# Patient Record
Sex: Female | Born: 1975 | Race: White | Hispanic: No | Marital: Married | Smoking: Never smoker
Health system: Southern US, Community
[De-identification: ages and names within clinical notes are randomized; demographics above are authoritative.]

## PROBLEM LIST (undated history)

## (undated) DIAGNOSIS — F329 Major depressive disorder, single episode, unspecified: Secondary | ICD-10-CM

## (undated) DIAGNOSIS — I10 Essential (primary) hypertension: Secondary | ICD-10-CM

## (undated) DIAGNOSIS — F32A Depression, unspecified: Secondary | ICD-10-CM

## (undated) DIAGNOSIS — M797 Fibromyalgia: Secondary | ICD-10-CM

## (undated) HISTORY — DX: Depression, unspecified: F32.A

## (undated) HISTORY — DX: Fibromyalgia: M79.7

## (undated) HISTORY — DX: Major depressive disorder, single episode, unspecified: F32.9

## (undated) HISTORY — PX: TONSILLECTOMY AND ADENOIDECTOMY: SUR1326

## (undated) HISTORY — DX: Essential (primary) hypertension: I10

## (undated) HISTORY — PX: CHOLECYSTECTOMY: SHX55

---

## 2007-01-01 ENCOUNTER — Encounter: Admission: RE | Admit: 2007-01-01 | Discharge: 2007-01-01 | Payer: Self-pay | Admitting: Orthopaedic Surgery

## 2008-09-09 ENCOUNTER — Ambulatory Visit: Payer: Self-pay | Admitting: Psychiatry

## 2008-09-29 ENCOUNTER — Ambulatory Visit: Payer: Self-pay | Admitting: Psychiatry

## 2009-04-08 ENCOUNTER — Encounter: Admission: RE | Admit: 2009-04-08 | Discharge: 2009-04-08 | Payer: Self-pay | Admitting: Family Medicine

## 2009-12-14 ENCOUNTER — Ambulatory Visit: Payer: Self-pay | Admitting: Psychiatry

## 2009-12-14 ENCOUNTER — Inpatient Hospital Stay (HOSPITAL_COMMUNITY): Admission: RE | Admit: 2009-12-14 | Discharge: 2009-12-17 | Payer: Self-pay | Admitting: Psychiatry

## 2010-04-11 LAB — COMPREHENSIVE METABOLIC PANEL
ALT: 45 U/L — ABNORMAL HIGH (ref 0–35)
Calcium: 9.3 mg/dL (ref 8.4–10.5)
Chloride: 104 mEq/L (ref 96–112)
GFR calc Af Amer: >60 mL/min (ref >60)
Glucose, Bld: 90 mg/dL (ref 70–99)
Sodium: 140 mEq/L (ref 135–145)
Total Bilirubin: 0.5 mg/dL (ref 0.3–1.2)
Total Protein: 7.1 g/dL (ref 6.0–8.3)

## 2010-04-11 LAB — URINE DRUGS OF ABUSE SCREEN W ALC, ROUTINE (REF LAB)
Barbiturate Quant, Ur: NEGATIVE
Benzodiazepines.: NEGATIVE
Creatinine,U: 60.3 mg/dL
Ethyl Alcohol: <10 mg/dL (ref <10)
Marijuana Metabolite: NEGATIVE
Methadone: NEGATIVE
Opiate Screen, Urine: NEGATIVE
Propoxyphene: NEGATIVE

## 2010-04-11 LAB — CBC
Hemoglobin: 13.2 g/dL (ref 12.0–15.0)
MCH: 32.4 pg (ref 26.0–34.0)
MCV: 93.8 fL (ref 78.0–100.0)
WBC: 11.8 10*3/uL — ABNORMAL HIGH (ref 4.0–10.5)

## 2010-04-11 LAB — URINALYSIS, ROUTINE W REFLEX MICROSCOPIC
Bilirubin Urine: NEGATIVE
Glucose, UA: NEGATIVE mg/dL
Specific Gravity, Urine: 1.012 (ref 1.005–1.030)
Urobilinogen, UA: 0.2 mg/dL (ref 0.0–1.0)

## 2010-04-11 LAB — PREGNANCY, URINE: Preg Test, Ur: NEGATIVE

## 2013-01-06 ENCOUNTER — Encounter (INDEPENDENT_AMBULATORY_CARE_PROVIDER_SITE_OTHER): Payer: Self-pay | Admitting: Surgery

## 2013-01-14 ENCOUNTER — Telehealth (INDEPENDENT_AMBULATORY_CARE_PROVIDER_SITE_OTHER): Payer: Self-pay | Admitting: Surgery

## 2013-01-14 ENCOUNTER — Ambulatory Visit (INDEPENDENT_AMBULATORY_CARE_PROVIDER_SITE_OTHER): Payer: BC Managed Care – PPO | Admitting: Surgery

## 2013-01-14 ENCOUNTER — Encounter (INDEPENDENT_AMBULATORY_CARE_PROVIDER_SITE_OTHER): Payer: Self-pay

## 2013-01-14 ENCOUNTER — Encounter (INDEPENDENT_AMBULATORY_CARE_PROVIDER_SITE_OTHER): Payer: Self-pay | Admitting: Surgery

## 2013-01-14 VITALS — BP 118/80 | HR 80 | Resp 16 | Ht 66.0 in | Wt 213.4 lb

## 2013-01-14 DIAGNOSIS — K8010 Calculus of gallbladder with chronic cholecystitis without obstruction: Secondary | ICD-10-CM

## 2013-01-14 NOTE — Progress Notes (Signed)
Patient ID: Catherine Watts, female   DOB: 1975-06-04, 37 y.o.   MRN: [id number]  Chief Complaint  Patient presents with  . Cholelithiasis    HPI Catherine Watts is a 37 y.o. female.  Referred by Dr. Maurice Small for evaluation of gallstones  HPI This is a Healthy 37 year old female who presents with a history of a severe episode of right upper quadrant abdominal pain that radiated around her side to her back associated with shortness of breath, nausea, vomiting. She called 911 and was taken to the emergency department. She was ruled out for cardiac disease and had an ultrasound showing gallstones. Her symptoms improved. She has had one mild attack in the last week. She has been trying to stick with a low fat diet. She reports occasional diarrhea. She does report some abdominal bloating.   Past Medical History  Diagnosis Date  . Depression   . Hypertension   . Fibromyalgia     Past Surgical History  Procedure Laterality Date  . Tonsillectomy and adenoidectomy      History reviewed. No pertinent family history.  Social History History  Substance Use Topics  . Smoking status: Never Smoker   . Smokeless tobacco: Not on file  . Alcohol Use: Yes     Comment: Occasionally    Allergies  Allergen Reactions  . Cephalexin Hives  . Penicillins Nausea Only    Current Outpatient Prescriptions  Medication Sig Dispense Refill  . DULoxetine (CYMBALTA) 60 MG capsule Take 60 mg by mouth daily.      Marland Kitchen HYDROcodone-homatropine (HYCODAN) 5-1.5 MG/5ML syrup Take 5 mLs by mouth every 6 (six) hours as needed for cough.      . topiramate (TOPAMAX) 25 MG capsule Take 25 mg by mouth 2 (two) times daily.       No current facility-administered medications for this visit.    Review of Systems Review of Systems  Constitutional: Negative for fever, chills and unexpected weight change.  HENT: Negative for congestion, hearing loss, sore throat, trouble swallowing and voice change.   Eyes:  Negative for visual disturbance.  Respiratory: Negative for cough and wheezing.   Cardiovascular: Negative for chest pain, palpitations and leg swelling.  Gastrointestinal: Positive for nausea, vomiting, abdominal pain, diarrhea and abdominal distention. Negative for constipation, blood in stool and anal bleeding.  Genitourinary: Negative for hematuria, vaginal bleeding and difficulty urinating.  Musculoskeletal: Negative for arthralgias.  Skin: Negative for rash and wound.  Neurological: Negative for seizures, syncope and headaches.  Hematological: Negative for adenopathy. Does not bruise/bleed easily.  Psychiatric/Behavioral: Negative for confusion.    Blood pressure 118/80, pulse 80, resp. rate 16, height 5\' 6"  (1.676 m), weight 213 lb 6.4 oz (96.798 kg).  Physical Exam Physical Exam WDWN in NAD HEENT:  EOMI, sclera anicteric Neck:  No masses, no thyromegaly Lungs:  CTA bilaterally; normal respiratory effort CV:  Regular rate and rhythm; no murmurs Abd:  +bowel sounds, soft, non-tender, no masses Ext:  Well-perfused; no edema Skin:  Warm, dry; no sign of jaundice  Data Reviewed  Labs from [employer] 11/18 WBC 10.3 Hgb 13.6 Plts 244  NA 135 K 3.5 Cl 102 CO2 22 BUN 13 Cr 0.9 Tbili 0.4 AST 24 ALT 24 Alk phos 70  Ultrasound - cholelithiasis without acute cholecystitis   Assessment    Chronic calculus cholecystitis     Plan    Laparoscopic cholecystectomy with intraoperative cholangiogram. The surgical procedure has been discussed with the patient.  Potential risks, benefits, alternative  treatments, and expected outcomes have been explained.  All of the patient's questions at this time have been answered.  The likelihood of reaching the patient's treatment goal is good.  The patient understand the proposed surgical procedure and wishes to proceed.         Shonta Phillis K. 01/14/2013, 4:42 PM

## 2013-01-14 NOTE — Telephone Encounter (Signed)
Pt made aware of [facility] financial obligation will call if and when to schedule surgery

## 2013-02-05 ENCOUNTER — Encounter (INDEPENDENT_AMBULATORY_CARE_PROVIDER_SITE_OTHER): Payer: Self-pay

## 2013-02-06 ENCOUNTER — Other Ambulatory Visit (HOSPITAL_COMMUNITY)
Admission: RE | Admit: 2013-02-06 | Discharge: 2013-02-06 | Disposition: A | Discharge status: Home / Self Care | Payer: BC Managed Care – PPO | Source: Ambulatory Visit | Attending: Obstetrics and Gynecology | Admitting: Obstetrics and Gynecology

## 2013-02-06 ENCOUNTER — Other Ambulatory Visit: Payer: Self-pay | Admitting: Obstetrics and Gynecology

## 2013-02-06 DIAGNOSIS — Z124 Encounter for screening for malignant neoplasm of cervix: Secondary | ICD-10-CM | POA: Insufficient documentation

## 2013-02-06 DIAGNOSIS — R87810 Cervical high risk human papillomavirus (HPV) DNA test positive: Secondary | ICD-10-CM | POA: Insufficient documentation

## 2013-02-06 DIAGNOSIS — Z1151 Encounter for screening for human papillomavirus (HPV): Secondary | ICD-10-CM | POA: Insufficient documentation

## 2013-02-26 ENCOUNTER — Other Ambulatory Visit (INDEPENDENT_AMBULATORY_CARE_PROVIDER_SITE_OTHER): Payer: Self-pay | Admitting: *Deleted

## 2013-02-26 ENCOUNTER — Other Ambulatory Visit (INDEPENDENT_AMBULATORY_CARE_PROVIDER_SITE_OTHER): Payer: Self-pay | Admitting: Surgery

## 2013-02-26 ENCOUNTER — Telehealth (INDEPENDENT_AMBULATORY_CARE_PROVIDER_SITE_OTHER): Payer: Self-pay | Admitting: General Surgery

## 2013-02-26 DIAGNOSIS — K824 Cholesterolosis of gallbladder: Secondary | ICD-10-CM

## 2013-02-26 MED ORDER — OXYCODONE-ACETAMINOPHEN 5-325 MG PO TABS: 1.0000 | ORAL_TABLET | ORAL | Status: AC | PRN

## 2013-02-26 NOTE — Telephone Encounter (Signed)
Her fiance, Ardis Hughsric Almond called and reported that she had some bloody drainage on the guaze covering her umbilical incision.  The was no blood leaking out from under the bandage.  I instructed him to reinforce the bandage with a piece of guaze them apply ice and direct pressure to the area for one hour.  If she started having active bleeding, I instructed him to take her to the ED.

## 2013-03-05 ENCOUNTER — Encounter (INDEPENDENT_AMBULATORY_CARE_PROVIDER_SITE_OTHER): Payer: Self-pay | Admitting: General Surgery

## 2013-03-10 ENCOUNTER — Encounter (INDEPENDENT_AMBULATORY_CARE_PROVIDER_SITE_OTHER): Payer: Self-pay | Admitting: Surgery

## 2013-03-10 ENCOUNTER — Ambulatory Visit (INDEPENDENT_AMBULATORY_CARE_PROVIDER_SITE_OTHER): Payer: BC Managed Care – PPO | Admitting: Surgery

## 2013-03-10 DIAGNOSIS — K8010 Calculus of gallbladder with chronic cholecystitis without obstruction: Secondary | ICD-10-CM

## 2013-03-10 NOTE — Progress Notes (Signed)
Status post laparoscopic cholecystectomy with cholangiogram on 02/26/13. Pathology confirmed some mild cholecystitis. Her symptoms are much improved. Appetite and bowel movements are normal. Her incisions are well-healed with no sign of infection. No abdominal tenderness.  She may resume full activity regular diet. Followup when necessary.  Wilmon ArmsMatthew K. Corliss Skainssuei, MD, [facility]  General/ Trauma Surgery  03/10/2013 4:23 PM

## 2013-03-30 ENCOUNTER — Other Ambulatory Visit: Payer: Self-pay | Admitting: Obstetrics and Gynecology

## 2014-04-07 ENCOUNTER — Other Ambulatory Visit (HOSPITAL_COMMUNITY)
Admission: RE | Admit: 2014-04-07 | Discharge: 2014-04-07 | Disposition: A | Discharge status: Home / Self Care | Payer: BC Managed Care – PPO | Source: Ambulatory Visit | Attending: Obstetrics and Gynecology | Admitting: Obstetrics and Gynecology

## 2014-04-07 ENCOUNTER — Other Ambulatory Visit: Payer: Self-pay | Admitting: Obstetrics and Gynecology

## 2014-04-07 DIAGNOSIS — Z01411 Encounter for gynecological examination (general) (routine) with abnormal findings: Secondary | ICD-10-CM | POA: Insufficient documentation

## 2014-04-07 DIAGNOSIS — Z1151 Encounter for screening for human papillomavirus (HPV): Secondary | ICD-10-CM | POA: Diagnosis present

## 2014-04-08 LAB — CYTOLOGY - PAP

## 2014-04-14 ENCOUNTER — Other Ambulatory Visit: Payer: Self-pay | Admitting: Obstetrics and Gynecology

## 2015-04-18 ENCOUNTER — Other Ambulatory Visit: Payer: Self-pay | Admitting: Obstetrics and Gynecology

## 2015-04-18 ENCOUNTER — Other Ambulatory Visit (HOSPITAL_COMMUNITY)
Admission: RE | Admit: 2015-04-18 | Discharge: 2015-04-18 | Disposition: A | Discharge status: Home / Self Care | Payer: BC Managed Care – PPO | Source: Ambulatory Visit | Attending: Obstetrics and Gynecology | Admitting: Obstetrics and Gynecology

## 2015-04-18 DIAGNOSIS — Z01411 Encounter for gynecological examination (general) (routine) with abnormal findings: Secondary | ICD-10-CM | POA: Insufficient documentation

## 2015-04-18 DIAGNOSIS — Z1151 Encounter for screening for human papillomavirus (HPV): Secondary | ICD-10-CM | POA: Insufficient documentation

## 2015-04-19 LAB — CYTOLOGY - PAP

## 2015-05-03 ENCOUNTER — Other Ambulatory Visit: Payer: Self-pay

## 2015-05-03 DIAGNOSIS — Z1231 Encounter for screening mammogram for malignant neoplasm of breast: Secondary | ICD-10-CM

## 2015-05-04 ENCOUNTER — Other Ambulatory Visit: Payer: Self-pay | Admitting: Obstetrics and Gynecology

## 2015-10-13 ENCOUNTER — Ambulatory Visit
Admission: RE | Admit: 2015-10-13 | Discharge: 2015-10-13 | Disposition: A | Discharge status: Home / Self Care | Payer: BC Managed Care – PPO | Source: Ambulatory Visit

## 2015-10-13 DIAGNOSIS — Z1231 Encounter for screening mammogram for malignant neoplasm of breast: Secondary | ICD-10-CM

## 2016-04-17 ENCOUNTER — Other Ambulatory Visit: Payer: Self-pay | Admitting: Obstetrics and Gynecology

## 2016-04-17 ENCOUNTER — Other Ambulatory Visit (HOSPITAL_COMMUNITY)
Admission: RE | Admit: 2016-04-17 | Discharge: 2016-04-17 | Disposition: A | Discharge status: Home / Self Care | Payer: BC Managed Care – PPO | Source: Ambulatory Visit | Attending: Obstetrics and Gynecology | Admitting: Obstetrics and Gynecology

## 2016-04-17 DIAGNOSIS — Z01411 Encounter for gynecological examination (general) (routine) with abnormal findings: Secondary | ICD-10-CM | POA: Diagnosis present

## 2016-04-17 DIAGNOSIS — Z1151 Encounter for screening for human papillomavirus (HPV): Secondary | ICD-10-CM | POA: Diagnosis not present

## 2016-04-22 LAB — CYTOLOGY - PAP
Adequacy: ABSENT
Diagnosis: NEGATIVE
HPV: NOT DETECTED

## 2016-09-03 ENCOUNTER — Other Ambulatory Visit: Payer: Self-pay | Admitting: Obstetrics and Gynecology

## 2016-09-03 DIAGNOSIS — Z1231 Encounter for screening mammogram for malignant neoplasm of breast: Secondary | ICD-10-CM

## 2016-09-24 ENCOUNTER — Ambulatory Visit (INDEPENDENT_AMBULATORY_CARE_PROVIDER_SITE_OTHER): Payer: BC Managed Care – PPO | Admitting: Podiatry

## 2016-09-24 ENCOUNTER — Ambulatory Visit (INDEPENDENT_AMBULATORY_CARE_PROVIDER_SITE_OTHER): Payer: BC Managed Care – PPO

## 2016-09-24 ENCOUNTER — Encounter: Payer: Self-pay | Admitting: Podiatry

## 2016-09-24 VITALS — BP 112/71 | HR 81 | Ht 66.0 in | Wt 220.0 lb

## 2016-09-24 DIAGNOSIS — M722 Plantar fascial fibromatosis: Secondary | ICD-10-CM | POA: Diagnosis not present

## 2016-09-24 DIAGNOSIS — M205X2 Other deformities of toe(s) (acquired), left foot: Secondary | ICD-10-CM

## 2016-09-24 MED ORDER — MELOXICAM 15 MG PO TABS: 15.0000 mg | ORAL_TABLET | Freq: Every day | ORAL | 0 refills | Status: DC

## 2016-09-24 NOTE — Progress Notes (Signed)
Subjective:  Patient ID: Catherine Watts, female    DOB: July 11, 1975,  MRN: [id number] HPI Chief Complaint  Patient presents with  . Foot Pain    Left heel pain hx of 3 mo   now pain in the great to joint hx of 1 mo     41 y.o. female presents with the above complaints. Reports pain in the L heel for 3 months. Endorses a hx of plantar fasciitis; has had this issue before, previously saw Dr. Elijah Birk who gave her a night splint and injections which alleviated the condition. Describes pain in her heel that is worst with periods of inactivity.  Also reports 1 month hx of L great toe joint pain. Pain described as stiffness. Hurts most at the end of the day. Denies prior treatment.  Past Medical History:  Diagnosis Date  . Depression   . Fibromyalgia   . Hypertension    Past Surgical History:  Procedure Laterality Date  . CHOLECYSTECTOMY    . TONSILLECTOMY AND ADENOIDECTOMY      Current Outpatient Prescriptions:  .  FLUoxetine (PROZAC) 10 MG capsule, , Disp: , Rfl:  .  JUNEL FE 24 1-20 MG-MCG(24) tablet, , Disp: , Rfl:  .  losartan (COZAAR) 50 MG tablet, , Disp: , Rfl:  .  DULoxetine (CYMBALTA) 60 MG capsule, Take 60 mg by mouth daily., Disp: , Rfl:  .  HYDROcodone-homatropine (HYCODAN) 5-1.5 MG/5ML syrup, Take 5 mLs by mouth every 6 (six) hours as needed for cough., Disp: , Rfl:  .  meloxicam (MOBIC) 15 MG tablet, Take 1 tablet (15 mg total) by mouth daily., Disp: 30 tablet, Rfl: 0 .  topiramate (TOPAMAX) 25 MG capsule, Take 25 mg by mouth 2 (two) times daily., Disp: , Rfl:   Allergies  Allergen Reactions  . Cephalexin Hives  . Penicillins Nausea Only   Review of Systems  All other systems reviewed and are negative.  Objective:   Vitals:   09/24/16 1624  BP: 112/71  Pulse: 81   General AA&O x3. Normal mood and affect.  Vascular Dorsalis pedis and posterior tibial pulses  present 2+ bilaterally  Capillary refill immediate to all digits. Pedal hair growth normal.    Neurologic Epicritic sensation grossly present bilaterally.  Dermatologic No open lesions. Interspaces clear of maceration. Nails well groomed and normal in appearance.  Orthopedic: MMT 5/5 in dorsiflexion, plantarflexion, inversion, and eversion bilaterally. Tender to palpation at the calcaneal tuber left. No pain with calcaneal squeeze left. Ankle ROM diminished range of motion bilat. Silfverskiold Test: positive left 1st MPJ ROM limited with bony end feel bilat with loading of the forefoot.   Radiographs: Taken and reviewed. No acute fractures. No evidence of calcaneal stress fracture. Small plantar calcaneal spurring noted. 1st Ray elevatus noted with dorsal spurring.  Assessment & Plan:  Patient was evaluated and treated and all questions answered.  Plantar Fasciitis, left - XR reviewed as above.  - Educated on icing and stretching. Instructions given. Ice pack dispensed. - Injection consisting of 1cc 0.5 % Marcaine plain, 1cc dexamethasone phospage, 0.5 cc Kenalog delivered to the plantar fascia. - Patient declined night splint as she states she has one.  Hallux Limitus, left - XR reviewed as above. - Discussed conservative vs surgical treatment of hallux limitus.  - Specifically discussed custom molded orthoses with the patient. Will make appointment with our orthotist. Plan for 1st ray cutout for hallux limitus, soft heel cup for plantar fasciitis.  Return in about 4 weeks (around  10/22/2016). 

## 2016-09-24 NOTE — Patient Instructions (Signed)

## 2016-09-27 ENCOUNTER — Ambulatory Visit (INDEPENDENT_AMBULATORY_CARE_PROVIDER_SITE_OTHER): Payer: BC Managed Care – PPO | Admitting: Orthotics

## 2016-09-27 DIAGNOSIS — M722 Plantar fascial fibromatosis: Secondary | ICD-10-CM

## 2016-09-27 DIAGNOSIS — M205X2 Other deformities of toe(s) (acquired), left foot: Secondary | ICD-10-CM

## 2016-09-27 NOTE — Progress Notes (Signed)
Patient presents today for CMFO to address FHL and PF (Left); Patient was scanned with first ray plantar flexed.  Goal is arch support, rear foot stability, reverse Mortons.  All to take pressure off PF at calcaneal insertion, and to drop hinge pin of 1 MPJ to enhance windlass.   Richy to fab.

## 2016-10-15 ENCOUNTER — Ambulatory Visit: Payer: BC Managed Care – PPO

## 2016-10-22 ENCOUNTER — Ambulatory Visit (INDEPENDENT_AMBULATORY_CARE_PROVIDER_SITE_OTHER): Payer: BC Managed Care – PPO | Admitting: Podiatry

## 2016-10-22 DIAGNOSIS — M722 Plantar fascial fibromatosis: Secondary | ICD-10-CM

## 2016-10-22 DIAGNOSIS — M205X2 Other deformities of toe(s) (acquired), left foot: Secondary | ICD-10-CM

## 2016-10-22 NOTE — Progress Notes (Signed)
Patient ID: Catherine Watts, female   DOB: Jun 09, 1975, 41 y.o.   MRN: [id number]   Patient presents for orthotic pick up orthotics will not work for type of shoe that patient must wear for work.  Rescheduling patient to see Raiford Noble again to discuss appropriate style options .

## 2016-10-22 NOTE — Patient Instructions (Signed)

## 2016-10-25 ENCOUNTER — Ambulatory Visit
Admission: RE | Admit: 2016-10-25 | Discharge: 2016-10-25 | Disposition: A | Discharge status: Home / Self Care | Payer: BC Managed Care – PPO | Source: Ambulatory Visit | Attending: Obstetrics and Gynecology | Admitting: Obstetrics and Gynecology

## 2016-10-25 DIAGNOSIS — Z1231 Encounter for screening mammogram for malignant neoplasm of breast: Secondary | ICD-10-CM

## 2016-11-01 ENCOUNTER — Encounter: Payer: BC Managed Care – PPO | Admitting: Orthotics

## 2016-11-01 ENCOUNTER — Other Ambulatory Visit: Payer: BC Managed Care – PPO | Admitting: Orthotics

## 2017-04-19 ENCOUNTER — Other Ambulatory Visit (HOSPITAL_COMMUNITY)
Admission: RE | Admit: 2017-04-19 | Discharge: 2017-04-19 | Disposition: A | Discharge status: Home / Self Care | Payer: BC Managed Care – PPO | Source: Ambulatory Visit | Attending: Obstetrics and Gynecology | Admitting: Obstetrics and Gynecology

## 2017-04-19 ENCOUNTER — Other Ambulatory Visit: Payer: Self-pay | Admitting: Obstetrics and Gynecology

## 2017-04-19 DIAGNOSIS — Z01411 Encounter for gynecological examination (general) (routine) with abnormal findings: Secondary | ICD-10-CM | POA: Insufficient documentation

## 2017-04-23 LAB — CYTOLOGY - PAP
Adequacy: ABSENT
Diagnosis: NEGATIVE
HPV (WINDOPATH): NOT DETECTED

## 2017-09-12 ENCOUNTER — Other Ambulatory Visit: Payer: Self-pay | Admitting: Obstetrics and Gynecology

## 2017-09-12 DIAGNOSIS — Z1231 Encounter for screening mammogram for malignant neoplasm of breast: Secondary | ICD-10-CM

## 2017-10-28 ENCOUNTER — Ambulatory Visit: Payer: BC Managed Care – PPO

## 2018-03-04 ENCOUNTER — Ambulatory Visit
Admission: RE | Admit: 2018-03-04 | Discharge: 2018-03-04 | Disposition: A | Discharge status: Home / Self Care | Payer: BC Managed Care – PPO | Source: Ambulatory Visit | Attending: Obstetrics and Gynecology | Admitting: Obstetrics and Gynecology

## 2018-03-04 DIAGNOSIS — Z1231 Encounter for screening mammogram for malignant neoplasm of breast: Secondary | ICD-10-CM

## 2019-02-19 ENCOUNTER — Other Ambulatory Visit: Payer: Self-pay | Admitting: Obstetrics and Gynecology

## 2019-02-19 DIAGNOSIS — Z1231 Encounter for screening mammogram for malignant neoplasm of breast: Secondary | ICD-10-CM

## 2019-03-24 ENCOUNTER — Ambulatory Visit: Payer: BC Managed Care – PPO

## 2019-04-24 ENCOUNTER — Ambulatory Visit: Payer: BC Managed Care – PPO

## 2019-06-12 ENCOUNTER — Ambulatory Visit: Payer: BC Managed Care – PPO

## 2020-08-30 IMAGING — MG DIGITAL SCREENING BILATERAL MAMMOGRAM WITH TOMO AND CAD
8 series · 8 of 24 positions shown · non-contrast
Comparison: Previous exam(s).

CLINICAL DATA: Screening.

EXAM:
DIGITAL SCREENING BILATERAL MAMMOGRAM WITH TOMO AND CAD

[L CC synth-2D]
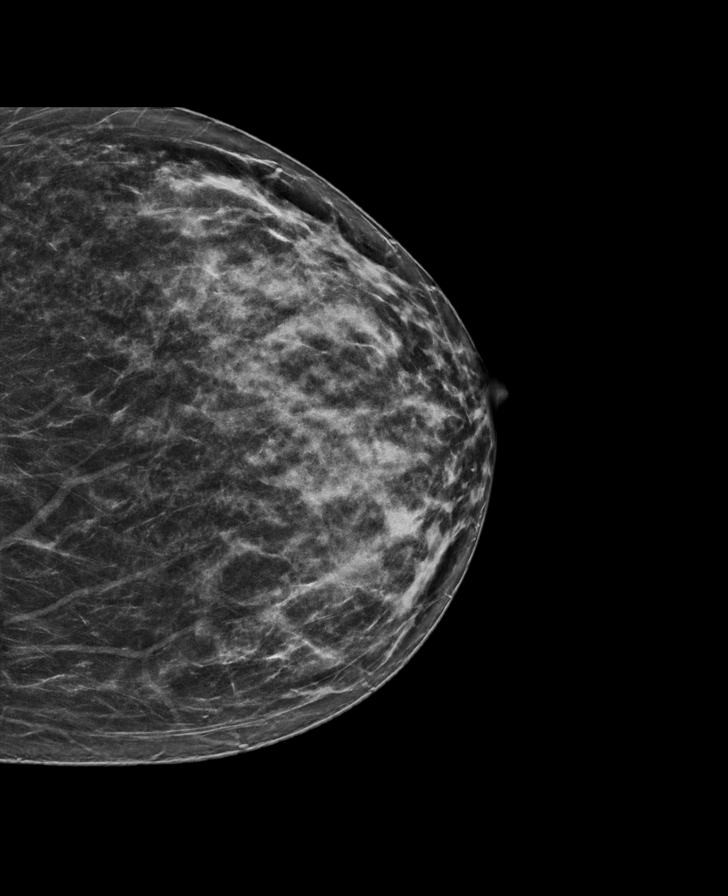

[L MLO synth-2D]
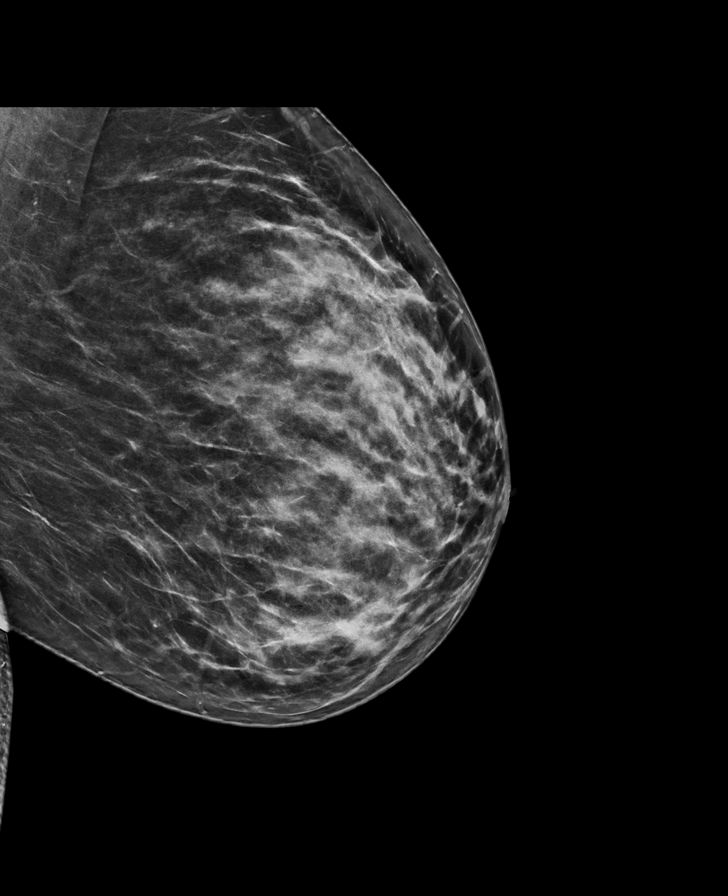

[R MLO synth-2D]
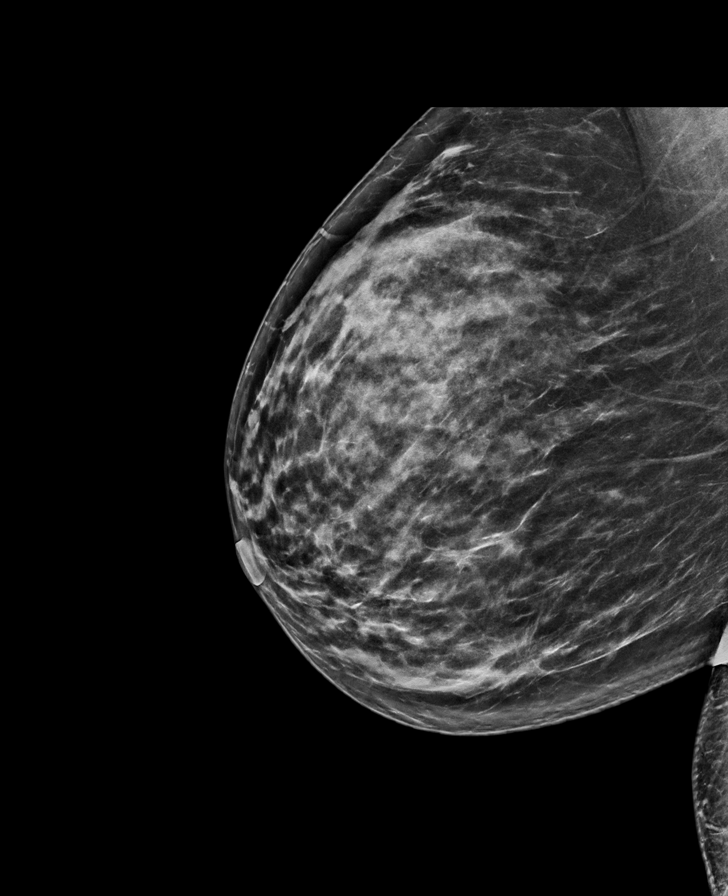

[R CC synth-2D]
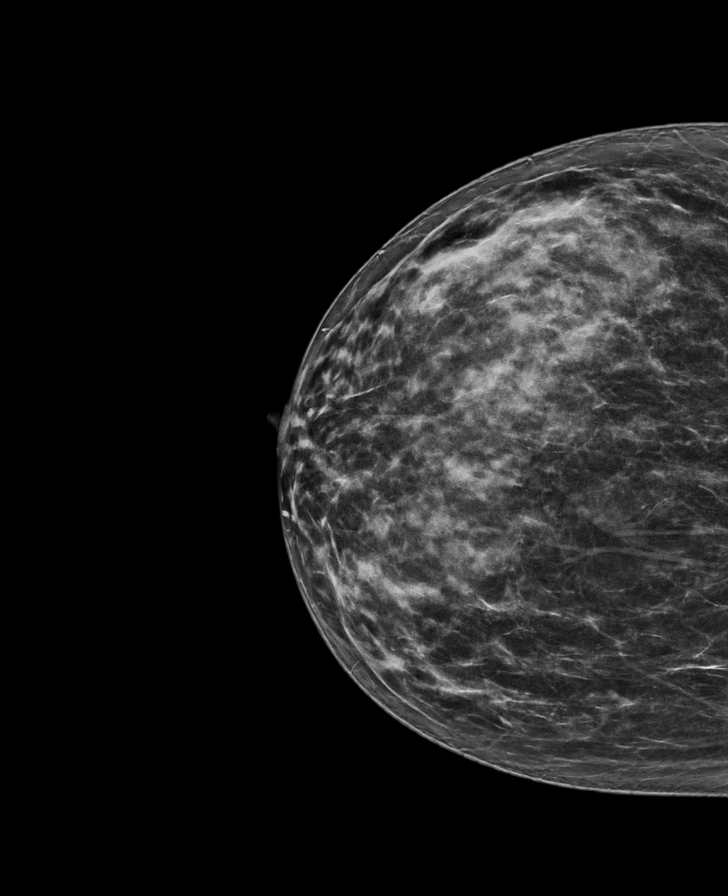

[L CC tomo · tomo slice 34/67.0]
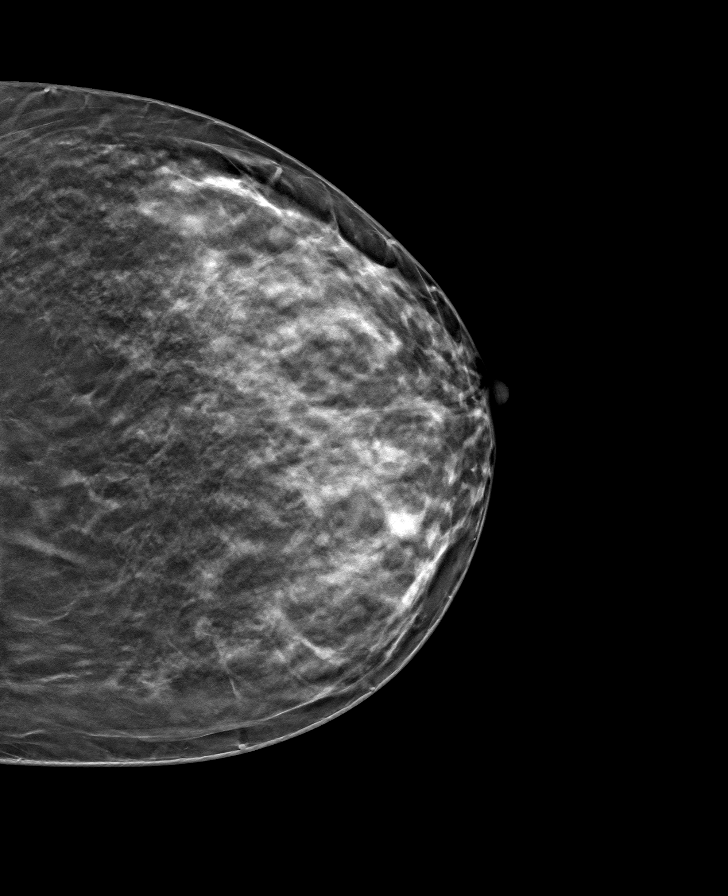

[L MLO tomo · tomo slice 38/75.0]
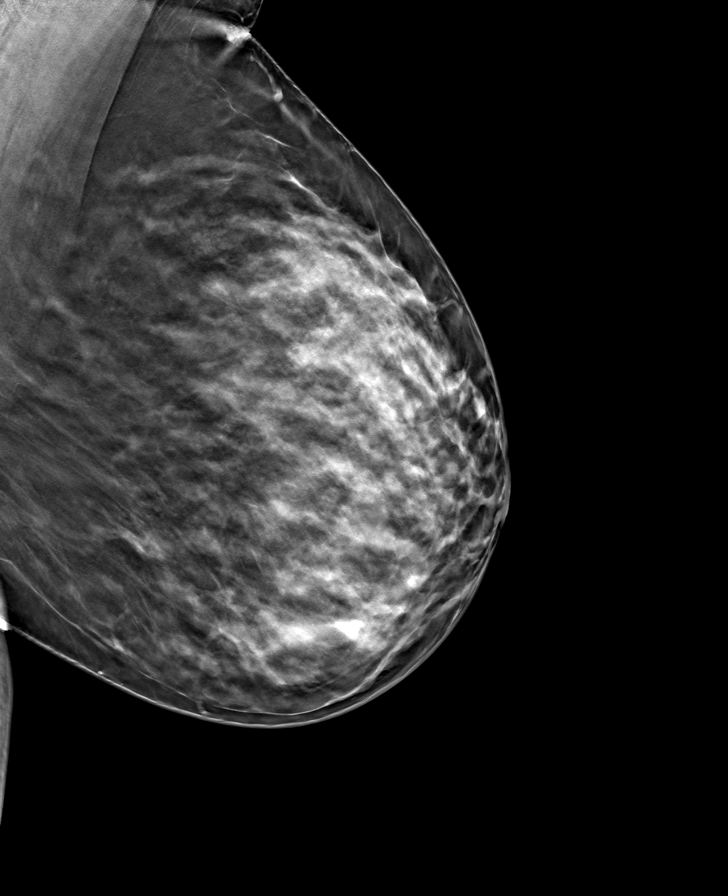

[R MLO tomo · tomo slice 38/75.0]
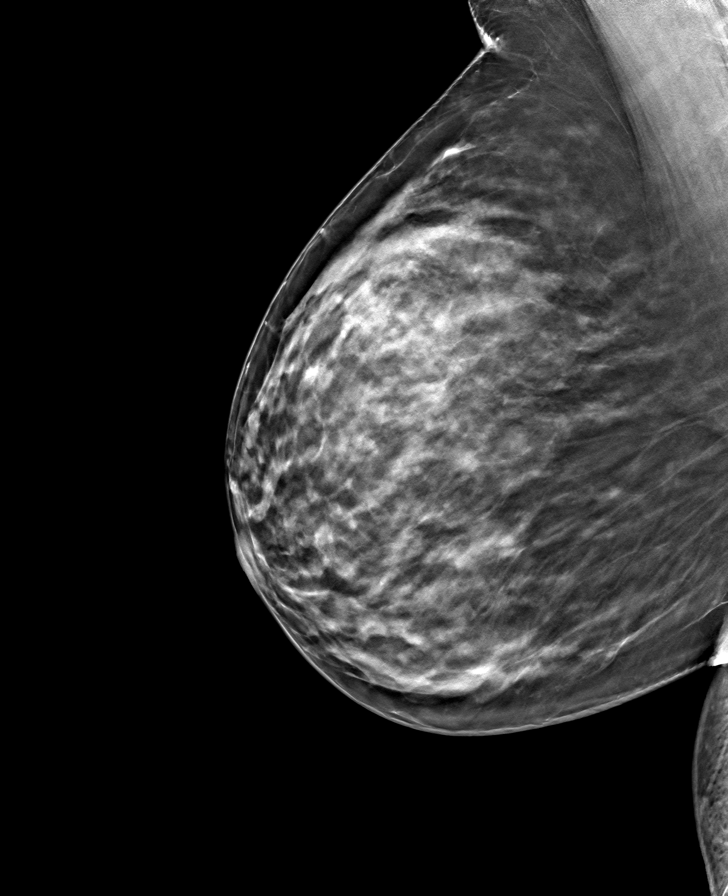

[R CC tomo · tomo slice 35/69.0]
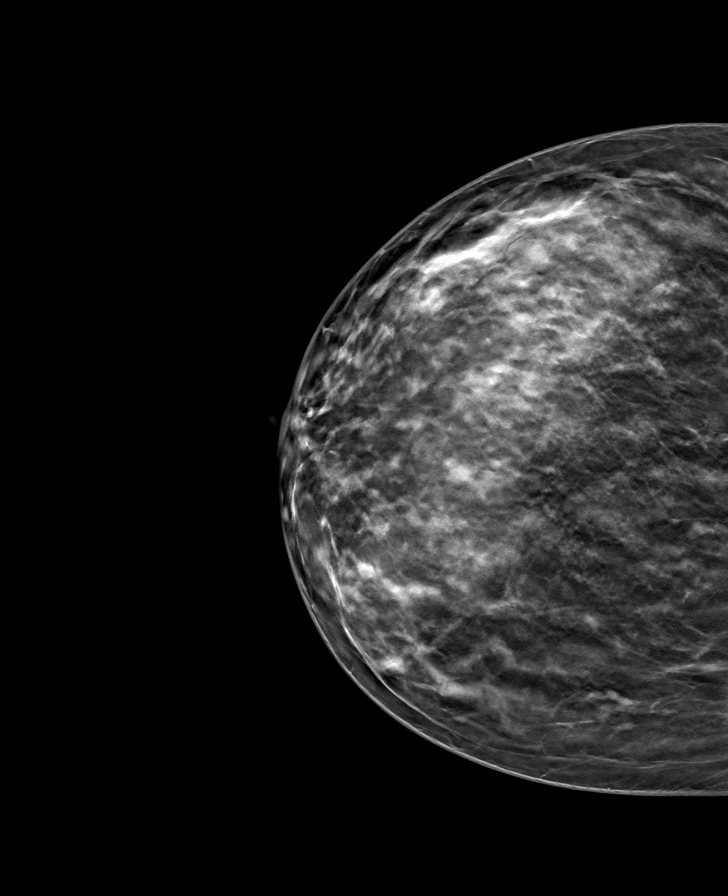

[8 of 24 positions shown; findings below may reference images not displayed]

ACR Breast Density Category c: The breast tissue is heterogeneously
dense, which may obscure small masses.
FINDINGS: There are no findings suspicious for malignancy. Images were
processed with CAD.
IMPRESSION: No mammographic evidence of malignancy. A result letter of this
screening mammogram will be mailed directly to the patient.

RECOMMENDATION:
Screening mammogram in one year. (Code:FT-U-LHB)

BI-RADS CATEGORY  1: Negative.

## 2020-09-06 ENCOUNTER — Ambulatory Visit (INDEPENDENT_AMBULATORY_CARE_PROVIDER_SITE_OTHER): Payer: BC Managed Care – PPO

## 2020-09-06 ENCOUNTER — Other Ambulatory Visit: Payer: Self-pay

## 2020-09-06 ENCOUNTER — Encounter: Payer: Self-pay | Admitting: Sports Medicine

## 2020-09-06 ENCOUNTER — Ambulatory Visit: Payer: BC Managed Care – PPO | Admitting: Sports Medicine

## 2020-09-06 DIAGNOSIS — M79674 Pain in right toe(s): Secondary | ICD-10-CM

## 2020-09-06 DIAGNOSIS — M779 Enthesopathy, unspecified: Secondary | ICD-10-CM

## 2020-09-06 DIAGNOSIS — M205X1 Other deformities of toe(s) (acquired), right foot: Secondary | ICD-10-CM

## 2020-09-06 DIAGNOSIS — M79671 Pain in right foot: Secondary | ICD-10-CM

## 2020-09-06 MED ORDER — MELOXICAM 15 MG PO TABS: 15.0000 mg | ORAL_TABLET | Freq: Every day | ORAL | 0 refills | Status: AC

## 2020-09-06 NOTE — Progress Notes (Addendum)
Subjective: Catherine Watts is a 45 y.o. female patient who presents to office for evaluation of right foot pain.  Patient reports that there is pain at her big toe joint that is starting to interfere some with daily activities states that it is hard when she bends her toe or any pressure to the toe when bending but there is pain across the joint and it radiates onto the foot as well states that there is no swelling no redness or numbness but does have some tingling denies any injury and reports that this pain has progressively been getting worse over the last 5 months.  Patient denies any other pedal complaints at this time.  Patient Active Problem List   Diagnosis Date Noted   Chronic cholecystitis with calculus 01/14/2013    Current Outpatient Medications on File Prior to Visit  Medication Sig Dispense Refill   ALPRAZolam (XANAX) 0.25 MG tablet Take by mouth.     atorvastatin (LIPITOR) 20 MG tablet Take 20 mg by mouth daily.     buPROPion (WELLBUTRIN XL) 150 MG 24 hr tablet Take 150 mg by mouth every morning.     clotrimazole-betamethasone (LOTRISONE) cream Apply topically.     FLUoxetine (PROZAC) 40 MG capsule Take 40 mg by mouth daily.     JUNEL FE 24 1-20 MG-MCG(24) tablet      losartan (COZAAR) 50 MG tablet      pantoprazole (PROTONIX) 40 MG tablet Take 40 mg by mouth daily.     No current facility-administered medications on file prior to visit.    Allergies  Allergen Reactions   Cephalexin Hives   Penicillins Nausea Only    Objective:  General: Alert and oriented x3 in no acute distress  Dermatology: No open lesions bilateral lower extremities, no webspace macerations, no ecchymosis bilateral, all nails x 10 are well manicured.  Pinch callus noted right great toe.  Vascular: Dorsalis Pedis and Posterior Tibial pedal pulses palpable, Capillary Fill Time 3 seconds,(+) pedal hair growth bilateral, no edema bilateral lower extremities, Temperature gradient within normal  limits.  Neurology: Michaell Cowing sensation intact via light touch bilateral.  Musculoskeletal: Mild tenderness with palpation at right foot at the first metatarsophalangeal joint,No pain with calf compression bilateral. There is decreased ankle rom with knee extending  vs flexed resembling gastroc equnius bilateral, Subtalar joint range of motion is within normal limits, there is no 1st ray hypermobility noted bilateral, decreased 1st MPJ rom Right>Left with functional limitus noted on weightbearing exam nonweightbearing patient does get about 35 degrees of dorsiflexion and 10 degrees of plantarflexion on the right however this is limited with weightbearing to 30 degrees dorsiflexion and 10 degrees plantarflexion on the right with hallux interphalangeal joint adaptation and very minimal medial first toe callus. Strength within normal limits in all groups bilateral.   Gait: Antalgic gait  Xrays  Right foot   Impression: First MPJ joint space narrowing consistent with hallux limitus and dorsal bone spur at the first MPJ  Assessment and Plan: Problem List Items Addressed This Visit   None Visit Diagnoses     Great toe pain, right    -  Primary   Hallux limitus of right foot       Capsulitis       Right foot pain           -Complete examination performed -Xrays reviewed -Discussed treatment options for hallux limitus -Dispensed toe guard/bunion shield for patient to use as directed at right great toe -Advised patient  to continue with good supportive shoes daily for foot type -Prescribed meloxicam for patient to take as directed -Patient to return to office in 4 to 6 weeks or sooner if condition worsens.  Advised patient if the toe cushion/bunion shield seem to help may benefit from custom orthotics however if pain continues may benefit from a further work-up for the inflammation around the joint or in the future possible surgery.  Asencion Islam, DPM

## 2020-09-07 ENCOUNTER — Other Ambulatory Visit: Payer: Self-pay | Admitting: Sports Medicine

## 2020-09-07 DIAGNOSIS — M205X1 Other deformities of toe(s) (acquired), right foot: Secondary | ICD-10-CM

## 2020-09-07 DIAGNOSIS — M79674 Pain in right toe(s): Secondary | ICD-10-CM

## 2020-10-11 ENCOUNTER — Ambulatory Visit: Payer: BC Managed Care – PPO | Admitting: Sports Medicine

## 2020-10-25 ENCOUNTER — Ambulatory Visit: Payer: BC Managed Care – PPO | Admitting: Sports Medicine

## 2023-08-13 ENCOUNTER — Other Ambulatory Visit: Payer: Self-pay | Admitting: Internal Medicine

## 2023-08-13 DIAGNOSIS — Z1231 Encounter for screening mammogram for malignant neoplasm of breast: Secondary | ICD-10-CM

## 2023-08-28 ENCOUNTER — Ambulatory Visit
Admission: RE | Admit: 2023-08-28 | Discharge: 2023-08-28 | Disposition: A | Discharge status: Home / Self Care | Payer: Self-pay | Source: Ambulatory Visit | Attending: Internal Medicine | Admitting: Internal Medicine

## 2023-08-28 DIAGNOSIS — Z1231 Encounter for screening mammogram for malignant neoplasm of breast: Secondary | ICD-10-CM

## 2024-02-19 ENCOUNTER — Ambulatory Visit
Admission: EM | Admit: 2024-02-19 | Discharge: 2024-02-19 | Disposition: A | Discharge status: Home / Self Care | Attending: Student | Admitting: Student

## 2024-02-19 ENCOUNTER — Encounter: Payer: Self-pay | Admitting: Emergency Medicine

## 2024-02-19 DIAGNOSIS — K8010 Calculus of gallbladder with chronic cholecystitis without obstruction: Secondary | ICD-10-CM | POA: Diagnosis not present

## 2024-02-19 DIAGNOSIS — N3001 Acute cystitis with hematuria: Secondary | ICD-10-CM | POA: Diagnosis not present

## 2024-02-19 LAB — POCT URINE DIPSTICK
Bilirubin, UA: NEGATIVE
Glucose, UA: NEGATIVE mg/dL
Ketones, POC UA: NEGATIVE mg/dL
Nitrite, UA: NEGATIVE
POC PROTEIN,UA: NEGATIVE
Spec Grav, UA: 1.01
Urobilinogen, UA: 0.2 U/dL
pH, UA: 6.5

## 2024-02-19 MED ORDER — NITROFURANTOIN MONOHYD MACRO 100 MG PO CAPS: 100.0000 mg | ORAL_CAPSULE | Freq: Two times a day (BID) | ORAL | 0 refills | Status: AC

## 2024-02-19 NOTE — ED Triage Notes (Signed)
 Pt c/o urge to urinate with little coming out, urinary frequency, and burning for 2-3 days.

## 2024-02-19 NOTE — ED Provider Notes (Signed)
 [name]    CSN: [id number] Arrival date & time: 02/19/24  9061      History   Chief Complaint No chief complaint on file.   HPI Catherine Watts is a 49 y.o. female presenting with urinary symptoms.  Medical history as below.  Last UTI >1 year ago.  Endorses symptoms of dysuria, frequency, urgency, sensation of incomplete void. Denies abd pain, CVAT  HPI  Past Medical History:  Diagnosis Date   Depression    Fibromyalgia    Hypertension     Patient Active Problem List   Diagnosis Date Noted   Chronic cholecystitis with calculus 01/14/2013    Past Surgical History:  Procedure Laterality Date   CHOLECYSTECTOMY     TONSILLECTOMY AND ADENOIDECTOMY      OB History   No obstetric history on file.      Home Medications    Prior to Admission medications  Medication Sig Start Date End Date Taking? Authorizing Provider  nitrofurantoin , macrocrystal-monohydrate, (MACROBID ) 100 MG capsule Take 1 capsule (100 mg total) by mouth 2 (two) times daily for 7 days. 02/19/24 02/26/24 Yes Davyn Elsasser E, PA-C  ALPRAZolam (XANAX) 0.25 MG tablet Take by mouth. 08/12/20   [provider]  atorvastatin (LIPITOR) 20 MG tablet Take 20 mg by mouth daily. 08/08/20   [provider]  buPROPion (WELLBUTRIN XL) 150 MG 24 hr tablet Take 150 mg by mouth every morning. 09/03/20   [provider]  clotrimazole-betamethasone (LOTRISONE) cream Apply topically. 07/15/20   [provider]  FLUoxetine (PROZAC) 40 MG capsule Take 40 mg by mouth daily. 09/03/20   [provider]  JUNEL FE 24 1-20 MG-MCG(24) tablet  07/07/16   [provider]  losartan (COZAAR) 50 MG tablet  08/05/16   [provider]  meloxicam  (MOBIC ) 15 MG tablet Take 1 tablet (15 mg total) by mouth daily. 09/06/20   Stover, Titorya, DPM  pantoprazole (PROTONIX) 40 MG tablet Take 40 mg by mouth daily. 08/08/20   [provider]    Family History History  reviewed. No pertinent family history.  Social History Social History[1]   Allergies   Cephalexin and Penicillins   Review of Systems Review of Systems  Constitutional:  Negative for chills and fever.  HENT:  Negative for sore throat.   Eyes:  Negative for pain and redness.  Respiratory:  Negative for shortness of breath.   Cardiovascular:  Negative for chest pain.  Gastrointestinal:  Negative for abdominal pain, diarrhea, nausea and vomiting.  Genitourinary:  Positive for dysuria, frequency and urgency. Negative for decreased urine volume, difficulty urinating, flank pain, genital sores and hematuria.  Musculoskeletal:  Negative for back pain.  Skin:  Negative for rash.     Physical Exam Triage Vital Signs ED Triage Vitals  Encounter Vitals Group     BP 02/19/24 0948 138/84     Girls Systolic BP Percentile --      Girls Diastolic BP Percentile --      Boys Systolic BP Percentile --      Boys Diastolic BP Percentile --      Pulse Rate 02/19/24 0948 78     Resp 02/19/24 0948 17     Temp 02/19/24 0948 97.7 F (36.5 C)     Temp Source 02/19/24 0948 Oral     SpO2 02/19/24 0948 98 %     Weight 02/19/24 0948 233 lb (105.7 kg)     Height 02/19/24 0948 5' 5.5 (1.664  m)     Head Circumference --      Peak Flow --      Pain Score 02/19/24 0958 3     Pain Loc --      Pain Education --      Exclude from Growth Chart --    No data found.  Updated Vital Signs BP 138/84 (BP Location: Right Arm)   Pulse 78   Temp 97.7 F (36.5 C) (Oral)   Resp 17   Ht 5' 5.5 (1.664 m)   Wt 233 lb (105.7 kg)   SpO2 98%   BMI 38.18 kg/m   Visual Acuity Right Eye Distance:   Left Eye Distance:   Bilateral Distance:    Right Eye Near:   Left Eye Near:    Bilateral Near:     Physical Exam Vitals reviewed.  Constitutional:      General: She is not in acute distress.    Appearance: Normal appearance. She is not ill-appearing.  HENT:     Head: Normocephalic and atraumatic.      Mouth/Throat:     Mouth: Mucous membranes are moist.     Comments: Moist mucous membranes Eyes:     Extraocular Movements: Extraocular movements intact.     Pupils: Pupils are equal, round, and reactive to light.  Cardiovascular:     Rate and Rhythm: Normal rate and regular rhythm.     Heart sounds: Normal heart sounds.  Pulmonary:     Effort: Pulmonary effort is normal.     Breath sounds: Normal breath sounds. No wheezing, rhonchi or rales.  Abdominal:     General: Bowel sounds are normal. There is no distension.     Palpations: Abdomen is soft. There is no mass.     Tenderness: There is abdominal tenderness in the suprapubic area. There is no right CVA tenderness, left CVA tenderness, guarding or rebound.  Skin:    General: Skin is warm.     Capillary Refill: Capillary refill takes less than 2 seconds.     Comments: Good skin turgor  Neurological:     General: No focal deficit present.     Mental Status: She is alert and oriented to person, place, and time.  Psychiatric:        Mood and Affect: Mood normal.        Behavior: Behavior normal.      UC Treatments / Results  Labs (all labs ordered are listed, but only abnormal results are displayed) Labs Reviewed  POCT URINE DIPSTICK - Abnormal; Notable for the following components:      Result Value   Blood, UA moderate (*)    Leukocytes, UA Large (3+) (*)    All other components within normal limits  URINE CULTURE    EKG   Radiology No results found.  Procedures Procedures (including critical care time)  Medications Ordered in UC Medications - No data to display  Initial Impression / Assessment and Plan / UC Course  I have reviewed the triage vital signs and the nursing notes.  Pertinent labs & imaging results that were available during my care of the patient were reviewed by me and considered in my medical decision making (see chart for details).     Patient is a pleasant 49 y.o. female presenting with  acute cystitis with hematuria. The patient is afebrile and nontachycardic.  Antipyretic has not been administered today.  S/p ablation  UA with moderate blood, large leuk.  Culture sent.  Kidney function was WNL on 10/2023 CMP.  Penicillin allergic.  Macrobid  sent.  Good hydration.  Return precautions as below.  Final Clinical Impressions(s) / UC Diagnoses   Final diagnoses:  Acute cystitis with hematuria  Chronic cholecystitis with calculus     Discharge Instructions      -You have a urinary tract infection -Macrobid  antibiotic twice daily x7 days. Take with food if you have a sensitive stomach -Drink plenty of fluids!  -We will call in 2-3 days if the culture indicates we need to change your antibiotic.  -Follow-up if new/worsening symptoms: new abdominal pain, new kidney pain, new fevers, etc     ED Prescriptions     Medication Sig Dispense Auth. Provider   nitrofurantoin , macrocrystal-monohydrate, (MACROBID ) 100 MG capsule Take 1 capsule (100 mg total) by mouth 2 (two) times daily for 7 days. 14 capsule Posey Jasmin E, PA-C      PDMP not reviewed this encounter.     [1]  Social History Tobacco Use   Smoking status: Never   Smokeless tobacco: Never  Substance Use Topics   Alcohol use: Yes    Comment: Occasionally   Drug use: No     Arlyss Leita BRAVO, PA-C 02/19/24 1033  "

## 2024-02-19 NOTE — Discharge Instructions (Addendum)
-  You have a urinary tract infection -Macrobid  antibiotic twice daily x7 days. Take with food if you have a sensitive stomach -Drink plenty of fluids!  -We will call in 2-3 days if the culture indicates we need to change your antibiotic.  -Follow-up if new/worsening symptoms: new abdominal pain, new kidney pain, new fevers, etc

## 2024-02-21 ENCOUNTER — Ambulatory Visit (HOSPITAL_COMMUNITY): Payer: Self-pay

## 2024-02-21 LAB — URINE CULTURE: Culture: >=100000 — AB
